# Patient Record
Sex: Female | Born: 2000 | Race: Black or African American | Hispanic: No | Marital: Single | Smoking: Never smoker
Health system: Southern US, Community
[De-identification: ages and names within clinical notes are randomized; demographics above are authoritative.]

## PROBLEM LIST (undated history)

## (undated) DIAGNOSIS — F32A Depression, unspecified: Secondary | ICD-10-CM

## (undated) DIAGNOSIS — F909 Attention-deficit hyperactivity disorder, unspecified type: Secondary | ICD-10-CM

## (undated) HISTORY — DX: Attention-deficit hyperactivity disorder, unspecified type: F90.9

## (undated) HISTORY — DX: Depression, unspecified: F32.A

---
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
## 2022-08-29 ENCOUNTER — Ambulatory Visit: Payer: Self-pay | Admitting: Skilled Nursing Facility1

## 2023-01-11 ENCOUNTER — Encounter: Payer: Self-pay | Admitting: Radiology

## 2023-01-11 ENCOUNTER — Ambulatory Visit (INDEPENDENT_AMBULATORY_CARE_PROVIDER_SITE_OTHER): Payer: Medicaid Other | Admitting: Radiology

## 2023-01-11 VITALS — BP 114/80 | Ht 61.5 in | Wt 220.0 lb

## 2023-01-11 DIAGNOSIS — N914 Secondary oligomenorrhea: Secondary | ICD-10-CM

## 2023-01-11 DIAGNOSIS — R5382 Chronic fatigue, unspecified: Secondary | ICD-10-CM | POA: Diagnosis not present

## 2023-01-11 LAB — CBC
HCT: 40.4 % (ref 35.0–45.0)
Hemoglobin: 13.2 g/dL (ref 11.7–15.5)
MCH: 27.6 pg (ref 27.0–33.0)
MCHC: 32.7 g/dL (ref 32.0–36.0)
MCV: 84.3 fL (ref 80.0–100.0)
MPV: 11.7 fL (ref 7.5–12.5)
Platelets: 316 10*3/uL (ref 140–400)
RBC: 4.79 10*6/uL (ref 3.80–5.10)
RDW: 13.1 % (ref 11.0–15.0)
WBC: 6.5 10*3/uL (ref 3.8–10.8)

## 2023-01-11 NOTE — Progress Notes (Signed)
   Kara Curry 2001/01/06 [id number]   History:  22 y.o. presents as a new patient with c/o irregular periods, skipping up to 3 months at a time. When period comes it is 2-3 days and very light. Increased acne and hair growth on face, neck and trunk. Was on OCPs x 4 years, Tri Sprintec, but stopped because of weight gain. Periods are not painful.   Gynecologic History Patient's last menstrual period was 11/30/2022 (approximate). Period Cycle (Days):  (skips periods) Period Duration (Days): 3 Period Pattern: (!) Irregular Menstrual Flow: Light Menstrual Control: Tampon Dysmenorrhea: None Contraception/Family planning: abstinence Sexually active: not currently   Obstetric History OB History  Gravida Para Term Preterm AB Living  0 0 0 0 0 0  SAB IAB Ectopic Multiple Live Births  0 0 0 0 0     The following portions of the patient's history were reviewed and updated as appropriate: allergies, current medications, past family history, past medical history, past social history, past surgical history, and problem list.  Review of Systems Pertinent items noted in HPI and remainder of comprehensive ROS otherwise negative.   Past medical history, past surgical history, family history and social history were all reviewed and documented in the EPIC chart.   Exam:  Vitals:   01/11/23 1034  BP: 114/80  Weight: 220 lb (99.8 kg)  Height: 5' 1.5" (1.562 m)   Body mass index is 40.9 kg/m.  General appearance:  Normal Thyroid:  Symmetrical, normal in size, without palpable masses or nodularity. Respiratory  Auscultation:  Clear without wheezing or rhonchi Cardiovascular  Auscultation:  Regular rate, without rubs, murmurs or gallops  Edema/varicosities:  Not grossly evident Abdominal  Soft,nontender, without masses, guarding or rebound.  Liver/spleen:  No organomegaly noted  Hernia:  None appreciated  Skin  Inspection:  acne on face, hirsutism  Genitourinary    Inguinal/mons:  Normal without inguinal adenopathy  External genitalia:  Normal appearing vulva with no masses, tenderness, or lesions  BUS/Urethra/Skene's glands:  Normal without masses or exudate  Vagina:  Normal appearing with normal color and discharge, no lesions  Cervix:  Normal appearing without discharge or lesions  Uterus:  Normal in size, shape and contour.  Mobile, nontender  Adnexa/parametria:     Rt: Normal in size, without masses or tenderness.   Lt: Normal in size, without masses or tenderness.  Anus and perineum: Normal   Raynelle Fanning, CMA present for exam  Assessment/Plan:   1. Secondary oligomenorrhea - Testos,Total,Free and SHBG (Female) - Prolactin - Estradiol - DHEA-sulfate  2. Chronic fatigue - Thyroid Panel With TSH - Vitamin D (25 hydroxy) - Vitamin B12 - CBC  3. Obesity, morbid, BMI 40.0-49.9 (HCC) - HgB A1c    Follow up 2 weeks to discuss management plan  Tanda Rockers WHNP-BC 10:51 AM 01/11/2023

## 2023-01-24 DIAGNOSIS — E282 Polycystic ovarian syndrome: Secondary | ICD-10-CM | POA: Insufficient documentation

## 2023-01-25 ENCOUNTER — Encounter: Payer: Self-pay | Admitting: Radiology

## 2023-01-25 ENCOUNTER — Ambulatory Visit (INDEPENDENT_AMBULATORY_CARE_PROVIDER_SITE_OTHER): Payer: Medicaid Other | Admitting: Radiology

## 2023-01-25 VITALS — BP 116/74

## 2023-01-25 DIAGNOSIS — E282 Polycystic ovarian syndrome: Secondary | ICD-10-CM | POA: Diagnosis not present

## 2023-01-25 DIAGNOSIS — E559 Vitamin D deficiency, unspecified: Secondary | ICD-10-CM | POA: Diagnosis not present

## 2023-01-25 DIAGNOSIS — Z30011 Encounter for initial prescription of contraceptive pills: Secondary | ICD-10-CM

## 2023-01-25 MED ORDER — NORETHIN-ETH ESTRAD-FE BIPHAS 1 MG-10 MCG / 10 MCG PO TABS: 1.0000 | ORAL_TABLET | Freq: Every day | ORAL | 0 refills | Status: DC

## 2023-01-25 NOTE — Progress Notes (Signed)
   Ebby Legleiter [birth year] [id number]   History:  22 y.o. presents to discuss labs drawn at her last visit with her mother. She presented 2 weeks ago as a new patient with c/o irregular periods, skipping up to 3 months at a time. When period comes it is 2-3 days and very light. Increased acne and hair growth on face, neck and trunk. Was on OCPs x 4 years, Tri Sprintec, but stopped because of weight gain. Periods are not painful.   Gynecologic History Patient's last menstrual period was 11/30/2022 (approximate).   Contraception/Family planning: abstinence Sexually active: not currently   Obstetric History OB History  Gravida Para Term Preterm AB Living  0 0 0 0 0 0  SAB IAB Ectopic Multiple Live Births  0 0 0 0 0     The following portions of the patient's history were reviewed and updated as appropriate: allergies, current medications, past family history, past medical history, past social history, past surgical history, and problem list.  Review of Systems Pertinent items noted in HPI and remainder of comprehensive ROS otherwise negative.   Past medical history, past surgical history, family history and social history were all reviewed and documented in the EPIC chart.    Latest Reference Range & Units 01/11/23 10:50  Vitamin D, 25-Hydroxy 30 - 100 ng/mL 17 (L)  Vitamin B12 200 - 1,100 pg/mL 367  DHEA-SO4 44 - 286 mcg/dL 409 (H)  Prolactin ng/mL 7.7  eAG (mmol/L) mmol/L 6.3  Hemoglobin A1C <5.7 % of total Hgb 5.6  Estradiol pg/mL 65  Free Testosterone 0.1 - 6.4 pg/mL 5.7  Sex Horm Binding Glob, Serum 17 - 124 nmol/L 24.9  Testosterone, Total, LC-MS-MS 2 - 45 ng/dL 37  TSH mIU/L 8.11  Thyroxine (T4) 5.3 - 11.7 mcg/dL 8.7  Free Thyroxine Index 1.4 - 3.8  2.3  T3 Uptake 22 - 35 % 26  (L): Data is abnormally low (H): Data is abnormally high  Exam:  Vitals:   01/25/23 0934  BP: 116/74   There is no height or weight on file to calculate BMI.  Physical  Exam Constitutional:      Appearance: Normal appearance. She is obese.  Pulmonary:     Effort: Pulmonary effort is normal.  Neurological:     Mental Status: She is alert.  Psychiatric:        Mood and Affect: Mood normal.        Thought Content: Thought content normal.        Judgment: Judgment normal.      Assessment/Plan:   1. PCOS (polycystic ovarian syndrome) - Norethindrone-Ethinyl Estradiol-Fe Biphas (LO LOESTRIN FE) 1 MG-10 MCG / 10 MCG tablet; Take 1 tablet by mouth daily.  Dispense: 84 tablet; Refill: 0  2. Oral contraception initial prescription - Norethindrone-Ethinyl Estradiol-Fe Biphas (LO LOESTRIN FE) 1 MG-10 MCG / 10 MCG tablet; Take 1 tablet by mouth daily.  Dispense: 84 tablet; Refill: 0  3. Vitamin D deficiency  Begin vit D3 5000iu with K2 nightly Increase outdoor time to atleast 15 mins a day  Discussed diet and exercise in detail. Focus on carbs and protein. Avoid skipping meals. Follow up 3months  Arlie Solomons B WHNP-BC 11:31 AM 01/25/2023

## 2023-03-03 ENCOUNTER — Telehealth: Payer: Medicaid Other | Admitting: Nurse Practitioner

## 2023-03-03 DIAGNOSIS — B9689 Other specified bacterial agents as the cause of diseases classified elsewhere: Secondary | ICD-10-CM

## 2023-03-03 DIAGNOSIS — J019 Acute sinusitis, unspecified: Secondary | ICD-10-CM | POA: Diagnosis not present

## 2023-03-03 MED ORDER — AMOXICILLIN-POT CLAVULANATE 875-125 MG PO TABS: 1.0000 | ORAL_TABLET | Freq: Two times a day (BID) | ORAL | 0 refills | Status: AC

## 2023-03-03 NOTE — Progress Notes (Signed)
Virtual Visit Consent   Kara Curry, you are scheduled for a virtual visit with a [facility] provider today. Just as with appointments in the office, your consent must be obtained to participate. Your consent will be active for this visit and any virtual visit you may have with one of our providers in the next 365 days. If you have a MyChart account, a copy of this consent can be sent to you electronically.  As this is a virtual visit, video technology does not allow for your provider to perform a traditional examination. This may limit your provider's ability to fully assess your condition. If your provider identifies any concerns that need to be evaluated in person or the need to arrange testing (such as labs, EKG, etc.), we will make arrangements to do so. Although advances in technology are sophisticated, we cannot ensure that it will always work on either your end or our end. If the connection with a video visit is poor, the visit may have to be switched to a telephone visit. With either a video or telephone visit, we are not always able to ensure that we have a secure connection.  By engaging in this virtual visit, you consent to the provision of healthcare and authorize for your insurance to be billed (if applicable) for the services provided during this visit. Depending on your insurance coverage, you may receive a charge related to this service.  I need to obtain your verbal consent now. Are you willing to proceed with your visit today? Kara Curry has provided verbal consent on 03/03/2023 for a virtual visit (video or telephone). Kara Rigg, NP  Date: 03/03/2023 10:00 AM  Virtual Visit via Video Note   I, Kara Curry, connected with  Kara Curry  ([id number], 02-24-2001) on 03/03/23 at 10:00 AM EDT by a video-enabled telemedicine application and verified that I am speaking with the correct person using two identifiers.  Location: Patient: Virtual Visit  Location Patient: Home Provider: Virtual Visit Location Provider: Office/Clinic   I discussed the limitations of evaluation and management by telemedicine and the availability of in person appointments. The patient expressed understanding and agreed to proceed.    History of Present Illness: Kara Curry is a 22 y.o. who identifies as a female who was assigned female at birth, and is being seen today for bacterial sinusitis   [name]  was seen by her PCP a few weeks ago and diagnosed with viral URI. She states despite conservative treatment over the past few weeks she is not feeling better. Has been using sinus rinse as well. She endorses sinus pressure, malaise and sore throat.     Problems: There are no problems to display for this patient.   Allergies: No Known Allergies Medications:  Current Outpatient Medications:    amoxicillin-clavulanate (AUGMENTIN) 875-125 MG tablet, Take 1 tablet by mouth 2 (two) times daily for 7 days., Disp: 14 tablet, Rfl: 0   Norethindrone-Ethinyl Estradiol-Fe Biphas (LO LOESTRIN FE) 1 MG-10 MCG / 10 MCG tablet, Take 1 tablet by mouth daily., Disp: 84 tablet, Rfl: 0  Observations/Objective: Patient is well-developed, well-nourished in no acute distress.  Resting comfortably  at home.  Head is normocephalic, atraumatic.  No labored breathing. Speech is clear and coherent with logical content.  Patient is alert and oriented at baseline.    Assessment and Plan: 1. Acute bacterial sinusitis - amoxicillin-clavulanate (AUGMENTIN) 875-125 MG tablet; Take 1 tablet by mouth 2 (two) times daily for 7 days.  Dispense:  14 tablet; Refill: 0  INSTRUCTIONS: use a humidifier for nasal congestion Drink plenty of fluids, rest and wash hands frequently to avoid the spread of infection Alternate tylenol and Motrin for relief of fever   Follow Up Instructions: I discussed the assessment and treatment plan with the patient. The patient was provided an  opportunity to ask questions and all were answered. The patient agreed with the plan and demonstrated an understanding of the instructions.  A copy of instructions were sent to the patient via MyChart unless otherwise noted below.    The patient was advised to call back or seek an in-person evaluation if the symptoms worsen or if the condition fails to improve as anticipated.  Time:  I spent 11 minutes with the patient via telehealth technology discussing the above problems/concerns.    Kara Rigg, NP

## 2023-03-03 NOTE — Patient Instructions (Signed)
  Randon Goldsmith, thank you for joining Claiborne Rigg, NP for today's virtual visit.  While this provider is not your primary care provider (PCP), if your PCP is located in our provider database this encounter information will be shared with them immediately following your visit.   A [facility] MyChart account gives you access to today's visit and all your visits, tests, and labs performed at [facility] " click here if you don't have a [facility] MyChart account or go to [email or web address]  Consent: (Patient) Kara Curry provided verbal consent for this virtual visit at the beginning of the encounter.  Current Medications:  Current Outpatient Medications:    amoxicillin-clavulanate (AUGMENTIN) 875-125 MG tablet, Take 1 tablet by mouth 2 (two) times daily for 7 days., Disp: 14 tablet, Rfl: 0   Norethindrone-Ethinyl Estradiol-Fe Biphas (LO LOESTRIN FE) 1 MG-10 MCG / 10 MCG tablet, Take 1 tablet by mouth daily., Disp: 84 tablet, Rfl: 0   Medications ordered in this encounter:  Meds ordered this encounter  Medications   amoxicillin-clavulanate (AUGMENTIN) 875-125 MG tablet    Sig: Take 1 tablet by mouth 2 (two) times daily for 7 days.    Dispense:  14 tablet    Refill:  0    Order Specific Question:   Supervising Provider    Answer:   Merrilee Jansky [id number]     *If you need refills on other medications prior to your next appointment, please contact your pharmacy*  Follow-Up: Call back or seek an in-person evaluation if the symptoms worsen or if the condition fails to improve as anticipated.  [facility] [phone]  Other Instructions INSTRUCTIONS: use a humidifier for nasal congestion Drink plenty of fluids, rest and wash hands frequently to avoid the spread of infection Alternate tylenol and Motrin for relief of fever    If you have been instructed to have an in-person evaluation today at a local Urgent Care facility, please  use the link below. It will take you to a list of all of our available [facility], including address, phone number and hours of operation. Please do not delay care.  [facility]  If you or a family member do not have a primary care provider, use the link below to schedule a visit and establish care. When you choose a [facility] primary care physician or advanced practice provider, you gain a long-term partner in health. Find a Primary Care Provider  Learn more about [facility]'s in-office and virtual care options: [facility] - Get Care Now

## 2023-03-13 NOTE — Telephone Encounter (Signed)
We can provide a letter saying she has been under my care since 01/2023 but I am not sure that will be what she is looking for.

## 2023-03-15 ENCOUNTER — Telehealth: Payer: Medicaid Other | Admitting: Physician Assistant

## 2023-03-15 ENCOUNTER — Encounter (HOSPITAL_COMMUNITY): Payer: Self-pay

## 2023-03-15 ENCOUNTER — Ambulatory Visit (HOSPITAL_COMMUNITY)
Admission: EM | Admit: 2023-03-15 | Discharge: 2023-03-15 | Disposition: A | Discharge status: Home / Self Care | Payer: Medicaid Other | Attending: Emergency Medicine | Admitting: Emergency Medicine

## 2023-03-15 DIAGNOSIS — J0101 Acute recurrent maxillary sinusitis: Secondary | ICD-10-CM

## 2023-03-15 DIAGNOSIS — J329 Chronic sinusitis, unspecified: Secondary | ICD-10-CM

## 2023-03-15 MED ORDER — DOXYCYCLINE HYCLATE 100 MG PO CAPS: 100.0000 mg | ORAL_CAPSULE | Freq: Two times a day (BID) | ORAL | 0 refills | Status: DC

## 2023-03-15 MED ORDER — PREDNISONE 20 MG PO TABS: 40.0000 mg | ORAL_TABLET | Freq: Every day | ORAL | 0 refills | Status: AC

## 2023-03-15 NOTE — ED Provider Notes (Signed)
[facility]    CSN: [id number] Arrival date & time: 03/15/23  1450      History   Chief Complaint Chief Complaint  Patient presents with   Nasal Congestion   Facial Pain    HPI Kara Curry is a 22 y.o. female.   Patient presents with persistent sinus pressure and pain x 2 weeks.  Patient endorses mild headache today.  Denies blurred vision, dizziness, lightheadedness, photophobia. Patient was prescribed Augmentin on 9/21 for bacterial sinusitis with no relief of symptoms.     Past Medical History:  Diagnosis Date   ADHD    Depression     There are no problems to display for this patient.   History reviewed. No pertinent surgical history.  OB History     Gravida  0   Para  0   Term  0   Preterm  0   AB  0   Living  0      SAB  0   IAB  0   Ectopic  0   Multiple  0   Live Births  0            Home Medications    Prior to Admission medications   Medication Sig Start Date End Date Taking? Authorizing Provider  doxycycline (VIBRAMYCIN) 100 MG capsule Take 1 capsule (100 mg total) by mouth 2 (two) times daily. 03/15/23  Yes Donica Derouin A, NP  Norethindrone-Ethinyl Estradiol-Fe Biphas (LO LOESTRIN FE) 1 MG-10 MCG / 10 MCG tablet Take 1 tablet by mouth daily. 01/25/23  Yes Chrzanowski, Jami B, NP  predniSONE (DELTASONE) 20 MG tablet Take 2 tablets (40 mg total) by mouth daily for 5 days. 03/15/23 03/20/23 Yes Letta Kocher, NP    Family History Family History  Problem Relation Age of Onset   Supraventricular tachycardia Mother    Hypertension Father    Prostate cancer Father    Hypertension Paternal Aunt    Thyroid cancer Paternal Aunt    Hypertension Paternal Uncle    Hypertension Paternal Grandmother    Non-Hodgkin's lymphoma Paternal Grandmother    Hypertension Paternal Grandfather     Social History Social History   Tobacco Use   Smoking status: Never    Passive exposure: Current   Smokeless tobacco:  Never  Substance Use Topics   Alcohol use: Not Currently   Drug use: Never     Allergies   Patient has no known allergies.   Review of Systems Review of Systems  Constitutional:  Negative for chills, fatigue and fever.  HENT:  Positive for congestion, rhinorrhea, sinus pressure and sinus pain. Negative for sore throat.   Eyes:  Negative for photophobia and visual disturbance.  Neurological:  Positive for headaches. Negative for dizziness, weakness and light-headedness.     Physical Exam Triage Vital Signs ED Triage Vitals  Encounter Vitals Group     BP 03/15/23 1459 106/72     Systolic BP Percentile --      Diastolic BP Percentile --      Pulse Rate 03/15/23 1459 69     Resp 03/15/23 1459 18     Temp 03/15/23 1459 98.2 F (36.8 C)     Temp Source 03/15/23 1459 Oral     SpO2 03/15/23 1459 98 %     Weight --      Height --      Head Circumference --      Peak Flow --  Pain Score 03/15/23 1502 4     Pain Loc --      Pain Education --      Exclude from Growth Chart --    No data found.  Updated Vital Signs BP 106/72 (BP Location: Left Arm)   Pulse 69   Temp 98.2 F (36.8 C) (Oral)   Resp 18   SpO2 98%   Visual Acuity Right Eye Distance:   Left Eye Distance:   Bilateral Distance:    Right Eye Near:   Left Eye Near:    Bilateral Near:     Physical Exam Vitals and nursing note reviewed.  Constitutional:      General: She is awake. She is not in acute distress.    Appearance: Normal appearance. She is well-developed and well-groomed. She is not ill-appearing, toxic-appearing or diaphoretic.  HENT:     Right Ear: Tympanic membrane, ear canal and external ear normal.     Left Ear: Tympanic membrane, ear canal and external ear normal.     Nose: Congestion and rhinorrhea present.     Right Sinus: Maxillary sinus tenderness present. No frontal sinus tenderness.     Left Sinus: Maxillary sinus tenderness present. No frontal sinus tenderness.      Mouth/Throat:     Mouth: Mucous membranes are moist.     Pharynx: Posterior oropharyngeal erythema and postnasal drip present. No pharyngeal swelling or oropharyngeal exudate.     Tonsils: No tonsillar exudate.  Neurological:     Mental Status: She is alert and oriented to person, place, and time.     GCS: GCS eye subscore is 4. GCS verbal subscore is 5. GCS motor subscore is 6.  Psychiatric:        Behavior: Behavior is cooperative.      UC Treatments / Results  Labs (all labs ordered are listed, but only abnormal results are displayed) Labs Reviewed - No data to display  EKG   Radiology No results found.  Procedures Procedures (including critical care time)  Medications Ordered in UC Medications - No data to display  Initial Impression / Assessment and Plan / UC Course  I have reviewed the triage vital signs and the nursing notes.  Pertinent labs & imaging results that were available during my care of the patient were reviewed by me and considered in my medical decision making (see chart for details).     Patient presented with 2-week history of sinus pressure and pain.  Endorses mild headache today.  Patient was prescribed Augmentin on 9/21 for bacterial sinusitis with no relief of symptoms.   Upon assessment patient has tenderness to maxillary sinuses, mild erythema noted to oropharynx, congestion and rhinorrhea present.  Patient reports thick, green mucus from nose.  No neurodeficits on exam. GCS 15.  Prescribed doxycycline and prednisone for recurrent sinusitis.  Discussed follow-up and return precautions. Final Clinical Impressions(s) / UC Diagnoses   Final diagnoses:  Acute recurrent maxillary sinusitis     Discharge Instructions      Start taking doxycycline twice daily for 10 days and prednisone once daily for 5 days.  Alternate between Tylenol and ibuprofen every 4-6 hours as needed for headache.  You can also take any over-the-counter decongestion  medication.  Return here if symptoms persist.    ED Prescriptions     Medication Sig Dispense Auth. Provider   doxycycline (VIBRAMYCIN) 100 MG capsule Take 1 capsule (100 mg total) by mouth 2 (two) times daily. 20 capsule [place], [place] A,  NP   predniSONE (DELTASONE) 20 MG tablet Take 2 tablets (40 mg total) by mouth daily for 5 days. 10 tablet Wynonia Lawman A, NP      PDMP not reviewed this encounter.   Wynonia Lawman A, NP 03/15/23 1556

## 2023-03-15 NOTE — Discharge Instructions (Signed)
Start taking doxycycline twice daily for 10 days and prednisone once daily for 5 days.  Alternate between Tylenol and ibuprofen every 4-6 hours as needed for headache.  You can also take any over-the-counter decongestion medication.  Return here if symptoms persist.

## 2023-03-15 NOTE — Progress Notes (Signed)
Because you are having symptoms despite treatment via e-visit, we are required to have you evaluated with a face to face visit. It is recommended to have you seen in person for examination to make sure proper next steps in treatment are given.    NOTE: There will be NO CHARGE for this eVisit   If you are having a true medical emergency please call 911.      For an urgent face to face visit, [facility] has eight urgent care centers for your convenience:   NEW!! [facility] at [facility] Get Driving Directions [phone] [address], [address] [place], [address]    [facility] at [facility] Get Driving Directions [phone] [address] [place], [place] [address]   [facility] Center [facility]) Get Driving Directions [phone] [address] [place], [place] [address]  [facility]) Get Driving Directions [phone] [address] [place],  [place]  [address]  [facility] - at [employer]  [phone] [address] [place],  [place] [address]   [facility] at [facility] Get Driving Directions [phone] [address] [place], [place] [address]   [facility] at [facility] Get Driving Directions  [phone] [address] [place], [place] [address]   [facility] at [facility] Directions [phone] [address], [address] [place], [place] [address]  Your MyChart E-visit questionnaire answers were reviewed by a board certified advanced clinical practitioner to complete your personal care plan based on your specific symptoms.  Thank you for using e-Visits.

## 2023-03-15 NOTE — ED Triage Notes (Signed)
Patient states sinus pressure and pain x 2 weeks. Pt reports a headache today.  Pain 4/10

## 2023-04-04 ENCOUNTER — Other Ambulatory Visit: Payer: Self-pay | Admitting: Radiology

## 2023-04-04 DIAGNOSIS — E282 Polycystic ovarian syndrome: Secondary | ICD-10-CM

## 2023-04-04 DIAGNOSIS — Z30011 Encounter for initial prescription of contraceptive pills: Secondary | ICD-10-CM

## 2023-04-04 NOTE — Telephone Encounter (Signed)
Med refill request: Lo Loestrin FE Last Office Visit: 01/25/23 Next Office Visit:  05/01/23 Last MMG (if hormonal med) n/a Refill authorized: Lo Loestrin FE  #84  Sent to provider for review.

## 2023-05-01 ENCOUNTER — Encounter: Payer: Self-pay | Admitting: Radiology

## 2023-05-01 ENCOUNTER — Ambulatory Visit (INDEPENDENT_AMBULATORY_CARE_PROVIDER_SITE_OTHER): Payer: Medicaid Other | Admitting: Radiology

## 2023-05-01 VITALS — BP 116/78 | Wt 215.0 lb

## 2023-05-01 DIAGNOSIS — N921 Excessive and frequent menstruation with irregular cycle: Secondary | ICD-10-CM | POA: Diagnosis not present

## 2023-05-01 DIAGNOSIS — E282 Polycystic ovarian syndrome: Secondary | ICD-10-CM

## 2023-05-01 DIAGNOSIS — N914 Secondary oligomenorrhea: Secondary | ICD-10-CM

## 2023-05-01 MED ORDER — TYBLUME 0.1-20 MG-MCG PO CHEW: 1.0000 | CHEWABLE_TABLET | Freq: Every day | ORAL | 0 refills | Status: DC

## 2023-05-01 NOTE — Progress Notes (Signed)
   Kara Curry [birth year] [id number]   History:  22 y.o. presents for follow up after starting LoLoestrin 3 months ago. Has only had BTB for 1-2 days each month since starting, no regular cycles. Prefers something where she may have an regular cycle.  She originally presented as a new patient with c/o irregular periods, skipping up to 3 months at a time. When period comes it is 2-3 days and very light. Increased acne and hair growth on face, neck and trunk. Was on OCPs x 4 years, Tri Sprintec, but stopped because of weight gain. Periods are not painful.   Gynecologic History Patient's last menstrual period was 11/11/2022 (approximate).   Contraception/Family planning: abstinence Sexually active: not currently   Obstetric History OB History  Gravida Para Term Preterm AB Living  0 0 0 0 0 0  SAB IAB Ectopic Multiple Live Births  0 0 0 0 0     The following portions of the patient's history were reviewed and updated as appropriate: allergies, current medications, past family history, past medical history, past social history, past surgical history, and problem list.  Review of Systems Pertinent items noted in HPI and remainder of comprehensive ROS otherwise negative.   Past medical history, past surgical history, family history and social history were all reviewed and documented in the EPIC chart.    Latest Reference Range & Units 01/11/23 10:50  Vitamin D, 25-Hydroxy 30 - 100 ng/mL 17 (L)  Vitamin B12 200 - 1,100 pg/mL 367  DHEA-SO4 44 - 286 mcg/dL 409 (H)  Prolactin ng/mL 7.7  eAG (mmol/L) mmol/L 6.3  Hemoglobin A1C <5.7 % of total Hgb 5.6  Estradiol pg/mL 65  Free Testosterone 0.1 - 6.4 pg/mL 5.7  Sex Horm Binding Glob, Serum 17 - 124 nmol/L 24.9  Testosterone, Total, LC-MS-MS 2 - 45 ng/dL 37  TSH mIU/L 8.11  Thyroxine (T4) 5.3 - 11.7 mcg/dL 8.7  Free Thyroxine Index 1.4 - 3.8  2.3  T3 Uptake 22 - 35 % 26  (L): Data is abnormally low (H): Data is abnormally  high  Exam:  Vitals:   05/01/23 0836  BP: 116/78  Weight: 215 lb (97.5 kg)   Body mass index is 39.97 kg/m.  Physical Exam Constitutional:      Appearance: Normal appearance. She is obese.  Pulmonary:     Effort: Pulmonary effort is normal.  Neurological:     Mental Status: She is alert.  Psychiatric:        Mood and Affect: Mood normal.        Thought Content: Thought content normal.        Judgment: Judgment normal.      Assessment/Plan:   1. Secondary oligomenorrhea  2. PCOS (polycystic ovarian syndrome)  3. Breakthrough bleeding on birth control pills Finish current pack (1 week left) then start new OCP  - Levonorgestrel-Ethinyl Estrad (TYBLUME) 0.1-20 MG-MCG CHEW; Chew 1 tablet by mouth daily.  Dispense: 84 tablet; Refill: 0   Follow up 3months  Aden Sek B WHNP-BC 8:50 AM 05/01/2023

## 2023-05-26 ENCOUNTER — Telehealth: Payer: Medicaid Other | Admitting: Emergency Medicine

## 2023-05-26 DIAGNOSIS — T753XXA Motion sickness, initial encounter: Secondary | ICD-10-CM | POA: Diagnosis not present

## 2023-05-26 MED ORDER — MECLIZINE HCL 25 MG PO TABS: 25.0000 mg | ORAL_TABLET | Freq: Three times a day (TID) | ORAL | 0 refills | Status: AC | PRN

## 2023-05-26 MED ORDER — SCOPOLAMINE 1 MG/3DAYS TD PT72: 1.0000 | MEDICATED_PATCH | TRANSDERMAL | 0 refills | Status: AC

## 2023-05-26 NOTE — Progress Notes (Signed)
E-Visit for Motion Sickness  We are sorry that you are not feeling well. Here is how we plan to help!  Based on what you have shared with me it looks like you have symptoms of motion sickness.  I have prescribed a medication that will help prevent or alleviate your symptoms:  Meclizine 25mg  by mouth three times per day as needed for nausea/motion sickness   Prevention:  You might feel better if you keep your eyes focused on outside while you are in motion. For example, if you are in a car, sit in the front and look in the direction you are moving; if you are on a boat, stay on the deck and look to the horizon. This helps make what you see match the movement you are feeling, and so you are less likely to feel sick.  You should also avoid reading, watching a movie, texting or reading messages, or looking at things close to you inside the vehicle you are riding in.  Use the seat head rest. Lean your head against the back of the seat or head rest when traveling in vehicles with seats to minimize head movements.  On a ship: When making your reservations, choose a cabin in the middle of the ship and near the waterline. When on board, go up on deck and focus on the horizon.  In an airplane: Request a window seat and look out the window. A seat over the front edge of the wing is the most preferable spot (the degree of motion is the lowest here). Direct the air vent to blow cool air on your face.  On a train: Always face forward and sit near a window.  In a vehicle: Sit in the front seat; if you are the passenger, look at the scenery in the distance. For some people, driving the vehicle (rather than being a passenger) is an instant remedy.  Avoid others who have become nauseous with motion sickness. Seeing and smelling others who have motion sickness may cause you to become sick.  GET HELP RIGHT AWAY IF:  Your symptoms do not improve or worsen within 2 days after treatment.  You cannot keep  down fluids after trying the medication.  Other associated symptoms such as severe headache, visual field changes, fever, or intractable nausea and vomiting.  MAKE SURE YOU:  Understand these instructions. Will watch your condition. Will get help right away if you are not doing well or get worse.  Thank you for choosing an e-visit.  Your e-visit answers were reviewed by a board certified advanced clinical practitioner to complete your personal care plan. Depending upon the condition, your plan could have included both over the counter or prescription medications.  Please review your pharmacy choice. Be sure that the pharmacy you have chosen is open so that you can pick up your prescription now.  If there is a problem you may message your provider in MyChart to have the prescription routed to another pharmacy.  Your safety is important to [place]. If you have drug allergies check your prescription carefully.   For the next 24 hours, you can use MyChart to ask questions about today's visit, request a non-urgent call back, or ask for a work or school excuse from your e-visit provider.  You will get an e-mail in the next two days asking about your experience. I hope that your e-visit has been valuable and will speed your recovery.   References or for more information: https://cross.com/ https://my.https://rowe.info/ https://www.uptodate.com  I have spent 5 minutes in review of e-visit questionnaire, review and updating patient chart, medical decision making and response to patient.   Rica Mast, PhD, FNP-BC

## 2023-05-26 NOTE — Addendum Note (Signed)
Addended by: Cathlyn Parsons on: 05/26/2023 11:52 AM   Modules accepted: Orders

## 2023-06-19 ENCOUNTER — Telehealth: Payer: Medicaid Other | Admitting: Nurse Practitioner

## 2023-06-19 DIAGNOSIS — J069 Acute upper respiratory infection, unspecified: Secondary | ICD-10-CM | POA: Diagnosis not present

## 2023-06-19 MED ORDER — BENZONATATE 100 MG PO CAPS: 100.0000 mg | ORAL_CAPSULE | Freq: Three times a day (TID) | ORAL | 0 refills | Status: DC | PRN

## 2023-06-19 MED ORDER — IPRATROPIUM BROMIDE 0.03 % NA SOLN: 2.0000 | Freq: Two times a day (BID) | NASAL | 12 refills | Status: DC

## 2023-06-19 NOTE — Progress Notes (Signed)

## 2023-06-26 ENCOUNTER — Other Ambulatory Visit: Payer: Self-pay

## 2023-06-26 DIAGNOSIS — E282 Polycystic ovarian syndrome: Secondary | ICD-10-CM

## 2023-06-26 DIAGNOSIS — N914 Secondary oligomenorrhea: Secondary | ICD-10-CM

## 2023-06-26 DIAGNOSIS — N921 Excessive and frequent menstruation with irregular cycle: Secondary | ICD-10-CM

## 2023-06-26 MED ORDER — TYBLUME 0.1-20 MG-MCG PO CHEW: 1.0000 | CHEWABLE_TABLET | Freq: Every day | ORAL | 0 refills | Status: DC

## 2023-06-26 NOTE — Telephone Encounter (Signed)
 Med refill request: tyblume chew Last OV: 05/01/2023 Next AEX: 07/27/2023 Last MMG (if hormonal med)  Refill authorized: Last Rx sent #[phone] with zero refills on 05/01/2023. Please approve or deny as appropriate.

## 2023-06-26 NOTE — Telephone Encounter (Signed)
 Med refill request: Tyblume  #84 Last OV: 05/01/23 Next OV: 07/27/23  Last MMG (if hormonal med) n/a Patient has new pharmacy:  [facility] mail order. [facility] contacted and VM left to cancel RX as patient has new pharmacy. Refill authorized:  Tyblume  #84 Sent to provider for review.

## 2023-06-27 MED ORDER — TYBLUME 0.1-20 MG-MCG PO CHEW: 1.0000 | CHEWABLE_TABLET | Freq: Every day | ORAL | 0 refills | Status: DC

## 2023-07-27 ENCOUNTER — Ambulatory Visit: Payer: Medicaid Other | Admitting: Radiology

## 2023-08-09 ENCOUNTER — Telehealth: Payer: Medicaid Other | Admitting: Physician Assistant

## 2023-08-09 ENCOUNTER — Other Ambulatory Visit (INDEPENDENT_AMBULATORY_CARE_PROVIDER_SITE_OTHER): Payer: Self-pay | Admitting: Emergency Medicine

## 2023-08-09 DIAGNOSIS — R42 Dizziness and giddiness: Secondary | ICD-10-CM

## 2023-08-09 NOTE — Progress Notes (Signed)
  Because of active dizziness which we cannot fully evaluate or treat via e-visit, I feel your condition warrants further evaluation and I recommend that you be seen in a face-to-face visit.   NOTE: There will be NO CHARGE for this E-Visit   If you are having a true medical emergency, please call 911.     For an urgent face to face visit, [facility] has multiple urgent care centers for your convenience.  Click the link below for the full list of locations and hours, walk-in wait times, appointment scheduling options and driving directions:  Urgent Care - [place], [place], [place], [place], [place], [place]  [place]     Your MyChart E-visit questionnaire answers were reviewed by a board certified advanced clinical practitioner to complete your personal care plan based on your specific symptoms.    Thank you for using e-Visits.

## 2023-08-13 ENCOUNTER — Other Ambulatory Visit (INDEPENDENT_AMBULATORY_CARE_PROVIDER_SITE_OTHER): Payer: Self-pay | Admitting: Emergency Medicine

## 2023-08-15 ENCOUNTER — Telehealth: Admitting: Nurse Practitioner

## 2023-08-15 DIAGNOSIS — T753XXD Motion sickness, subsequent encounter: Secondary | ICD-10-CM

## 2023-08-15 NOTE — Progress Notes (Signed)
 Kara Curry,  Reviewing your chart this seems to be a recurring or chronic issue that is requiring ongoing management. E-visits were designed for short term solutions and not meant for chronic management. Since you are still associated with a primary care practice we would recommend follow up with them for further exploration of long term options   I feel your condition warrants further evaluation and I recommend that you be seen for a face to face visit.  Please contact your primary care physician practice to be seen. Many offices offer virtual options to be seen via video if you are not comfortable going in person to a medical facility at this time.  NOTE: You will NOT be charged for this eVisit.  If you do not have a PCP, [facility] offers a free physician referral service available at [phone]. Our trained staff has the experience, knowledge and resources to put you in touch with a physician who is right for you.    If you are having a true medical emergency please call 911.   Your e-visit answers were reviewed by a board certified advanced clinical practitioner to complete your personal care plan.  Thank you for using e-Visits.

## 2023-08-28 ENCOUNTER — Other Ambulatory Visit: Payer: Self-pay | Admitting: Radiology

## 2023-08-28 DIAGNOSIS — N921 Excessive and frequent menstruation with irregular cycle: Secondary | ICD-10-CM

## 2023-08-28 DIAGNOSIS — E282 Polycystic ovarian syndrome: Secondary | ICD-10-CM

## 2023-08-28 DIAGNOSIS — N914 Secondary oligomenorrhea: Secondary | ICD-10-CM

## 2023-08-28 NOTE — Telephone Encounter (Signed)
 Med refill request: Tyblume.  Patient is requesting 1 year supply Last OV: 05/01/23, 3 month follow up, not scheduled Next AEX: none scheduled Last MMG (if hormonal med) n/a Refill authorized:  Tyblume #[id number], needs appointment.  Please approve or deny as appropriate.  Noted needs appointment.

## 2023-10-05 ENCOUNTER — Encounter: Payer: Self-pay | Admitting: Radiology

## 2023-10-05 ENCOUNTER — Ambulatory Visit (INDEPENDENT_AMBULATORY_CARE_PROVIDER_SITE_OTHER): Admitting: Radiology

## 2023-10-05 VITALS — BP 118/76 | Wt 212.8 lb

## 2023-10-05 DIAGNOSIS — E282 Polycystic ovarian syndrome: Secondary | ICD-10-CM | POA: Diagnosis not present

## 2023-10-05 DIAGNOSIS — Z3041 Encounter for surveillance of contraceptive pills: Secondary | ICD-10-CM | POA: Diagnosis not present

## 2023-10-05 MED ORDER — DROSPIRENONE-ETHINYL ESTRADIOL 3-0.02 MG PO TABS: 1.0000 | ORAL_TABLET | Freq: Every day | ORAL | 1 refills | Status: DC

## 2023-10-05 NOTE — Progress Notes (Signed)
   Kara Curry 11/20/2000 [id number]   History:  23 y.o. presents for follow up after starting Loestrin 6 months ago. weight gain, increased hair growth, increased acne. She had been originally started on LoLoestrin but did not like not having a cycle.She is also very frustrated by her weight, interested in starting a GLP 1.  She originally presented as a new patient with c/o irregular periods, skipping up to 3 months at a time. When period comes it is 2-3 days and very light. Increased acne and hair growth on face, neck and trunk. Was on OCPs x 4 years, Tri Sprintec, but stopped because of weight gain. Periods are not painful.   Gynecologic History LMP 10/05/23   Contraception/Family planning: abstinence Sexually active: not currently   Obstetric History OB History  Gravida Para Term Preterm AB Living  0 0 0 0 0 0  SAB IAB Ectopic Multiple Live Births  0 0 0 0 0     The following portions of the patient's history were reviewed and updated as appropriate: allergies, current medications, past family history, past medical history, past social history, past surgical history, and problem list.  Review of Systems Pertinent items noted in HPI and remainder of comprehensive ROS otherwise negative.   Past medical history, past surgical history, family history and social history were all reviewed and documented in the EPIC chart.    Latest Reference Range & Units 01/11/23 10:50  Vitamin D , 25-Hydroxy 30 - 100 ng/mL 17 (L)  Vitamin B12 200 - 1,100 pg/mL 367  DHEA-SO4 44 - 286 mcg/dL 130 (H)  Prolactin ng/mL 7.7  eAG (mmol/L) mmol/L 6.3  Hemoglobin A1C <5.7 % of total Hgb 5.6  Estradiol  pg/mL 65  Free Testosterone 0.1 - 6.4 pg/mL 5.7  Sex Horm Binding Glob, Serum 17 - 124 nmol/L 24.9  Testosterone, Total, LC-MS-MS 2 - 45 ng/dL 37  TSH mIU/L 8.65  Thyroxine (T4) 5.3 - 11.7 mcg/dL 8.7  Free Thyroxine Index 1.4 - 3.8  2.3  T3 Uptake 22 - 35 % 26  (L): Data is abnormally  low (H): Data is abnormally high  Exam:  Vitals:   10/05/23 1031  BP: 118/76  Weight: 212 lb 12.8 oz (96.5 kg)   Body mass index is 39.56 kg/m.  Physical Exam Constitutional:      Appearance: Normal appearance. She is obese.  Pulmonary:     Effort: Pulmonary effort is normal.  Neurological:     Mental Status: She is alert.  Psychiatric:        Mood and Affect: Mood normal.        Thought Content: Thought content normal.        Judgment: Judgment normal.      Assessment/Plan:   1. PCOS (polycystic ovarian syndrome) (Primary) - drospirenone-ethinyl estradiol  (YAZ) 3-0.02 MG tablet; Take 1 tablet by mouth daily.  Dispense: 84 tablet; Refill: 1  2. Oral contraceptive pill surveillance  - drospirenone-ethinyl estradiol  (YAZ) 3-0.02 MG tablet; Take 1 tablet by mouth daily.  Dispense: 84 tablet; Refill: 1   3. Morbid obesity (HCC) Will call insurance to see if they cover GLP-1 and contact us  to let us  know In the meantime increase protein and fiber, increase exercise.    Follow up 3months for AEX and med check  Synetta Eves B WHNP-BC 10:58 AM 10/05/2023

## 2023-10-07 ENCOUNTER — Other Ambulatory Visit: Payer: Self-pay | Admitting: Radiology

## 2023-10-07 DIAGNOSIS — E282 Polycystic ovarian syndrome: Secondary | ICD-10-CM

## 2023-10-07 DIAGNOSIS — N914 Secondary oligomenorrhea: Secondary | ICD-10-CM

## 2023-10-07 DIAGNOSIS — N921 Excessive and frequent menstruation with irregular cycle: Secondary | ICD-10-CM

## 2023-10-08 NOTE — Telephone Encounter (Signed)
 Rx refill refused due to change in therapy on 10/05/2023.   Routing to provider for final review and encounter closed.

## 2023-12-01 ENCOUNTER — Telehealth

## 2023-12-01 DIAGNOSIS — R197 Diarrhea, unspecified: Secondary | ICD-10-CM

## 2023-12-05 NOTE — Progress Notes (Signed)
  Because you are having diarrhea for such an extended period of time and are having new incontinence, I feel your condition warrants further evaluation and I recommend that you be seen in a face-to-face visit.   NOTE: There will be NO CHARGE for this E-Visit   If you are having a true medical emergency, please call 911.     For an urgent face to face visit, [facility] has multiple urgent care centers for your convenience.  Click the link below for the full list of locations and hours, walk-in wait times, appointment scheduling options and driving directions:  Urgent Care - [place], [place], [place], [place], [place], [place]  [place]     Your MyChart E-visit questionnaire answers were reviewed by a board certified advanced clinical practitioner to complete your personal care plan based on your specific symptoms.    Thank you for using e-Visits.  Approximately 5 minutes was spent documenting and reviewing patient's chart.

## 2023-12-18 DIAGNOSIS — F411 Generalized anxiety disorder: Secondary | ICD-10-CM | POA: Diagnosis not present

## 2023-12-18 DIAGNOSIS — F900 Attention-deficit hyperactivity disorder, predominantly inattentive type: Secondary | ICD-10-CM | POA: Diagnosis not present

## 2023-12-18 DIAGNOSIS — F324 Major depressive disorder, single episode, in partial remission: Secondary | ICD-10-CM | POA: Diagnosis not present

## 2023-12-25 ENCOUNTER — Other Ambulatory Visit: Payer: Self-pay

## 2023-12-25 DIAGNOSIS — E282 Polycystic ovarian syndrome: Secondary | ICD-10-CM

## 2023-12-25 DIAGNOSIS — Z3041 Encounter for surveillance of contraceptive pills: Secondary | ICD-10-CM

## 2023-12-25 MED ORDER — DROSPIRENONE-ETHINYL ESTRADIOL 3-0.02 MG PO TABS: 1.0000 | ORAL_TABLET | Freq: Every day | ORAL | 0 refills | Status: DC

## 2023-12-25 NOTE — Telephone Encounter (Signed)
.  Med refill request: YAZ  Last OV: 10/05/23 Next AEX: 01/08/24  Last MMG (if hormonal med) Refill authorized: Please Advise?

## 2023-12-31 ENCOUNTER — Other Ambulatory Visit (HOSPITAL_BASED_OUTPATIENT_CLINIC_OR_DEPARTMENT_OTHER): Payer: Self-pay

## 2023-12-31 DIAGNOSIS — F324 Major depressive disorder, single episode, in partial remission: Secondary | ICD-10-CM | POA: Diagnosis not present

## 2023-12-31 DIAGNOSIS — F411 Generalized anxiety disorder: Secondary | ICD-10-CM | POA: Diagnosis not present

## 2023-12-31 DIAGNOSIS — F900 Attention-deficit hyperactivity disorder, predominantly inattentive type: Secondary | ICD-10-CM | POA: Diagnosis not present

## 2023-12-31 MED ORDER — ESCITALOPRAM OXALATE 10 MG PO TABS
10.0000 mg | ORAL_TABLET | Freq: Every day | ORAL | 1 refills | Status: AC
  Filled 2023-12-31: qty 90, 90d supply, fill #0

## 2024-01-01 ENCOUNTER — Other Ambulatory Visit (HOSPITAL_BASED_OUTPATIENT_CLINIC_OR_DEPARTMENT_OTHER): Payer: Self-pay

## 2024-01-08 ENCOUNTER — Other Ambulatory Visit (HOSPITAL_COMMUNITY)
Admission: RE | Admit: 2024-01-08 | Discharge: 2024-01-08 | Disposition: A | Discharge status: Home / Self Care | Source: Ambulatory Visit | Attending: Radiology | Admitting: Radiology

## 2024-01-08 ENCOUNTER — Encounter: Payer: Self-pay | Admitting: Radiology

## 2024-01-08 ENCOUNTER — Other Ambulatory Visit (HOSPITAL_BASED_OUTPATIENT_CLINIC_OR_DEPARTMENT_OTHER): Payer: Self-pay

## 2024-01-08 ENCOUNTER — Ambulatory Visit (INDEPENDENT_AMBULATORY_CARE_PROVIDER_SITE_OTHER): Admitting: Radiology

## 2024-01-08 VITALS — BP 120/82 | HR 61 | Ht 62.0 in | Wt 218.4 lb

## 2024-01-08 DIAGNOSIS — Z01419 Encounter for gynecological examination (general) (routine) without abnormal findings: Secondary | ICD-10-CM | POA: Diagnosis not present

## 2024-01-08 DIAGNOSIS — Z1331 Encounter for screening for depression: Secondary | ICD-10-CM | POA: Diagnosis not present

## 2024-01-08 DIAGNOSIS — Z113 Encounter for screening for infections with a predominantly sexual mode of transmission: Secondary | ICD-10-CM

## 2024-01-08 DIAGNOSIS — Z3041 Encounter for surveillance of contraceptive pills: Secondary | ICD-10-CM | POA: Diagnosis not present

## 2024-01-08 DIAGNOSIS — E282 Polycystic ovarian syndrome: Secondary | ICD-10-CM | POA: Diagnosis not present

## 2024-01-08 MED ORDER — DROSPIRENONE-ETHINYL ESTRADIOL 3-0.02 MG PO TABS
1.0000 | ORAL_TABLET | Freq: Every day | ORAL | 4 refills | Status: AC
  Filled 2024-01-08 – 2024-02-20 (×2): qty 84, 84d supply, fill #0

## 2024-01-08 NOTE — Progress Notes (Signed)
 Kawanda Drumheller April 03, 2001 [id number]   History:  23 y.o. G0 presents for annual exam. Doing well on Yaz. No new gyn concerns.   Gynecologic History Patient's last menstrual period was 01/01/2024 (approximate). Period Cycle (Days): 28 Period Duration (Days): 3 Period Pattern: Regular Menstrual Flow: Light Menstrual Control: Thin pad Dysmenorrhea: None Contraception/Family planning: OCP (estrogen/progesterone) Sexually active: yes   Obstetric History OB History  Gravida Para Term Preterm AB Living  0 0 0 0 0 0  SAB IAB Ectopic Multiple Live Births  0 0 0 0 0       01/08/2024    8:34 AM  Depression screen PHQ 2/9  Decreased Interest 0  Down, Depressed, Hopeless 0  PHQ - 2 Score 0     The following portions of the patient's history were reviewed and updated as appropriate: allergies, current medications, past family history, past medical history, past social history, past surgical history, and problem list.  Review of Systems  All other systems reviewed and are negative.   Past medical history, past surgical history, family history and social history were all reviewed and documented in the EPIC chart.  Exam:  Vitals:   01/08/24 0834  BP: 120/82  Pulse: 61  SpO2: 97%  Weight: 218 lb 6.4 oz (99.1 kg)  Height: 5' 2 (1.575 m)   Body mass index is 39.95 kg/m.  Physical Exam Vitals and nursing note reviewed. Exam conducted with a chaperone present.  Constitutional:      Appearance: Normal appearance. She is normal weight.  HENT:     Head: Normocephalic and atraumatic.  Neck:     Thyroid : No thyroid  mass, thyromegaly or thyroid  tenderness.  Cardiovascular:     Rate and Rhythm: Regular rhythm.     Heart sounds: Normal heart sounds.  Pulmonary:     Effort: Pulmonary effort is normal.     Breath sounds: Normal breath sounds.  Chest:  Breasts:    Breasts are symmetrical.     Right: Normal. No inverted nipple, mass, nipple discharge, skin change or  tenderness.     Left: Normal. No inverted nipple, mass, nipple discharge, skin change or tenderness.  Abdominal:     General: Abdomen is flat. Bowel sounds are normal.     Palpations: Abdomen is soft.  Genitourinary:    General: Normal vulva.     Vagina: Normal. No vaginal discharge, bleeding or lesions.     Cervix: Normal. No discharge or lesion.     Uterus: Normal. Not enlarged and not tender.      Adnexa: Right adnexa normal and left adnexa normal.       Right: No mass, tenderness or fullness.         Left: No mass, tenderness or fullness.    Lymphadenopathy:     Upper Body:     Right upper body: No axillary adenopathy.     Left upper body: No axillary adenopathy.  Skin:    General: Skin is warm and dry.  Neurological:     Mental Status: She is alert and oriented to person, place, and time.  Psychiatric:        Mood and Affect: Mood normal.        Thought Content: Thought content normal.        Judgment: Judgment normal.      Darice Hoit, CMA present for exam  Assessment/Plan:   1. Well woman exam with routine gynecological exam (Primary) - Cytology - PAP( [place])  2. Screening  for STDs (sexually transmitted diseases) - Cytology - PAP( [place])  3. PCOS (polycystic ovarian syndrome) - drospirenone -ethinyl estradiol  (YAZ) 3-0.02 MG tablet; Take 1 tablet by mouth daily.  Dispense: 84 tablet; Refill: 4  4. Oral contraceptive pill surveillance - drospirenone -ethinyl estradiol  (YAZ) 3-0.02 MG tablet; Take 1 tablet by mouth daily.  Dispense: 84 tablet; Refill: 4  5. Depression screen negative    Return in about 1 year (around 01/07/2025) for Annual.  GINETTE SHASTA NOVAK WHNP-BC 8:42 AM 01/08/2024

## 2024-01-08 NOTE — Patient Instructions (Signed)

## 2024-01-13 LAB — CYTOLOGY - PAP
Adequacy: ABSENT
Chlamydia: NEGATIVE
Comment: NEGATIVE
Comment: NEGATIVE
Comment: NORMAL
Diagnosis: NEGATIVE
Neisseria Gonorrhea: NEGATIVE
Trichomonas: NEGATIVE

## 2024-01-14 ENCOUNTER — Ambulatory Visit: Payer: Self-pay | Admitting: Radiology

## 2024-02-07 ENCOUNTER — Encounter

## 2024-02-20 ENCOUNTER — Other Ambulatory Visit (HOSPITAL_COMMUNITY): Payer: Self-pay

## 2024-02-20 ENCOUNTER — Other Ambulatory Visit: Payer: Self-pay

## 2024-02-25 DIAGNOSIS — Z131 Encounter for screening for diabetes mellitus: Secondary | ICD-10-CM | POA: Diagnosis not present

## 2024-02-25 DIAGNOSIS — R5383 Other fatigue: Secondary | ICD-10-CM | POA: Diagnosis not present

## 2024-02-25 DIAGNOSIS — R42 Dizziness and giddiness: Secondary | ICD-10-CM | POA: Diagnosis not present

## 2024-02-25 DIAGNOSIS — R350 Frequency of micturition: Secondary | ICD-10-CM | POA: Diagnosis not present

## 2024-04-01 ENCOUNTER — Other Ambulatory Visit (HOSPITAL_COMMUNITY): Payer: Self-pay

## 2024-04-01 DIAGNOSIS — F324 Major depressive disorder, single episode, in partial remission: Secondary | ICD-10-CM | POA: Diagnosis not present

## 2024-04-01 DIAGNOSIS — F411 Generalized anxiety disorder: Secondary | ICD-10-CM | POA: Diagnosis not present

## 2024-04-01 DIAGNOSIS — F900 Attention-deficit hyperactivity disorder, predominantly inattentive type: Secondary | ICD-10-CM | POA: Diagnosis not present

## 2024-04-01 MED ORDER — ESCITALOPRAM OXALATE 20 MG PO TABS
20.0000 mg | ORAL_TABLET | Freq: Every day | ORAL | 0 refills | Status: DC
  Filled 2024-04-01: qty 90, 90d supply, fill #0

## 2024-05-01 ENCOUNTER — Other Ambulatory Visit (HOSPITAL_COMMUNITY): Payer: Self-pay

## 2024-05-01 DIAGNOSIS — F411 Generalized anxiety disorder: Secondary | ICD-10-CM | POA: Diagnosis not present

## 2024-05-01 DIAGNOSIS — F324 Major depressive disorder, single episode, in partial remission: Secondary | ICD-10-CM | POA: Diagnosis not present

## 2024-05-01 MED ORDER — BUSPIRONE HCL 7.5 MG PO TABS
7.5000 mg | ORAL_TABLET | Freq: Two times a day (BID) | ORAL | 1 refills | Status: AC
  Filled 2024-05-01: qty 60, 30d supply, fill #0

## 2024-05-01 MED ORDER — ESCITALOPRAM OXALATE 20 MG PO TABS
20.0000 mg | ORAL_TABLET | Freq: Every day | ORAL | 0 refills | Status: AC
  Filled 2024-05-01 – 2024-06-17 (×2): qty 90, 90d supply, fill #0

## 2024-06-17 ENCOUNTER — Other Ambulatory Visit (HOSPITAL_COMMUNITY): Payer: Self-pay

## 2025-01-08 ENCOUNTER — Ambulatory Visit: Admitting: Radiology
